# Patient Record
Sex: Male | Born: 1970 | Race: White | Hispanic: No | Smoking: Current every day smoker
Health system: Southern US, Community
[De-identification: ages and names within clinical notes are randomized; demographics above are authoritative.]

---

## 2010-07-23 ENCOUNTER — Ambulatory Visit: Payer: Self-pay | Admitting: Diagnostic Radiology

## 2010-07-23 ENCOUNTER — Emergency Department (HOSPITAL_BASED_OUTPATIENT_CLINIC_OR_DEPARTMENT_OTHER): Admission: EM | Admit: 2010-07-23 | Discharge: 2010-07-23 | Payer: Self-pay | Admitting: Emergency Medicine

## 2011-01-31 LAB — CBC
MCHC: 35.3 g/dL (ref 30.0–36.0)
MCV: 96.6 fL (ref 78.0–100.0)
Platelets: 232 10*3/uL (ref 150–400)
RDW: 12.3 % (ref 11.5–15.5)
WBC: 12.8 10*3/uL — ABNORMAL HIGH (ref 4.0–10.5)

## 2011-01-31 LAB — DIFFERENTIAL
Basophils Absolute: 0.1 10*3/uL (ref 0.0–0.1)
Basophils Relative: 1 % (ref 0–1)
Eosinophils Relative: 0 % (ref 0–5)
Lymphocytes Relative: 5 % — ABNORMAL LOW (ref 12–46)
Monocytes Absolute: 0.5 10*3/uL (ref 0.1–1.0)

## 2011-01-31 LAB — BASIC METABOLIC PANEL
BUN: 13 mg/dL (ref 6–23)
Chloride: 99 mEq/L (ref 96–112)
Creatinine, Ser: 0.8 mg/dL (ref 0.4–1.5)
GFR calc non Af Amer: >60 mL/min (ref >60)
Glucose, Bld: 236 mg/dL — ABNORMAL HIGH (ref 70–99)

## 2011-06-21 IMAGING — CR DG TIBIA/FIBULA 2V*R*
4 series · 4 of 4 positions shown · non-contrast
Comparison: None

CLINICAL DATA: Laceration to right lower leg.

RIGHT TIBIA AND FIBULA - 2 VIEW

[t tib/fib ap right (1 of 2)]
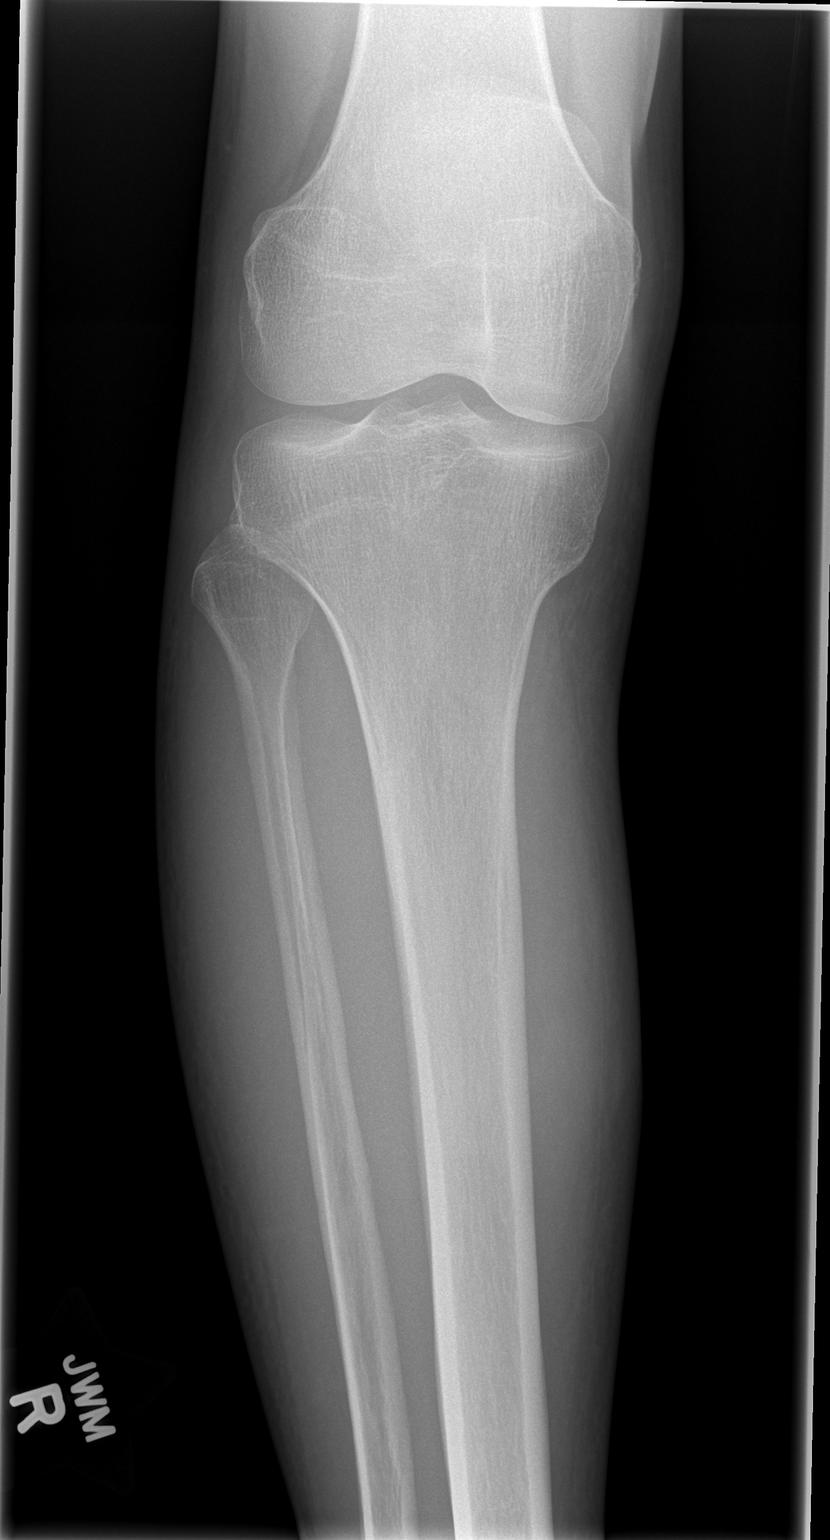

[t tib/fib ap right (2 of 2)]
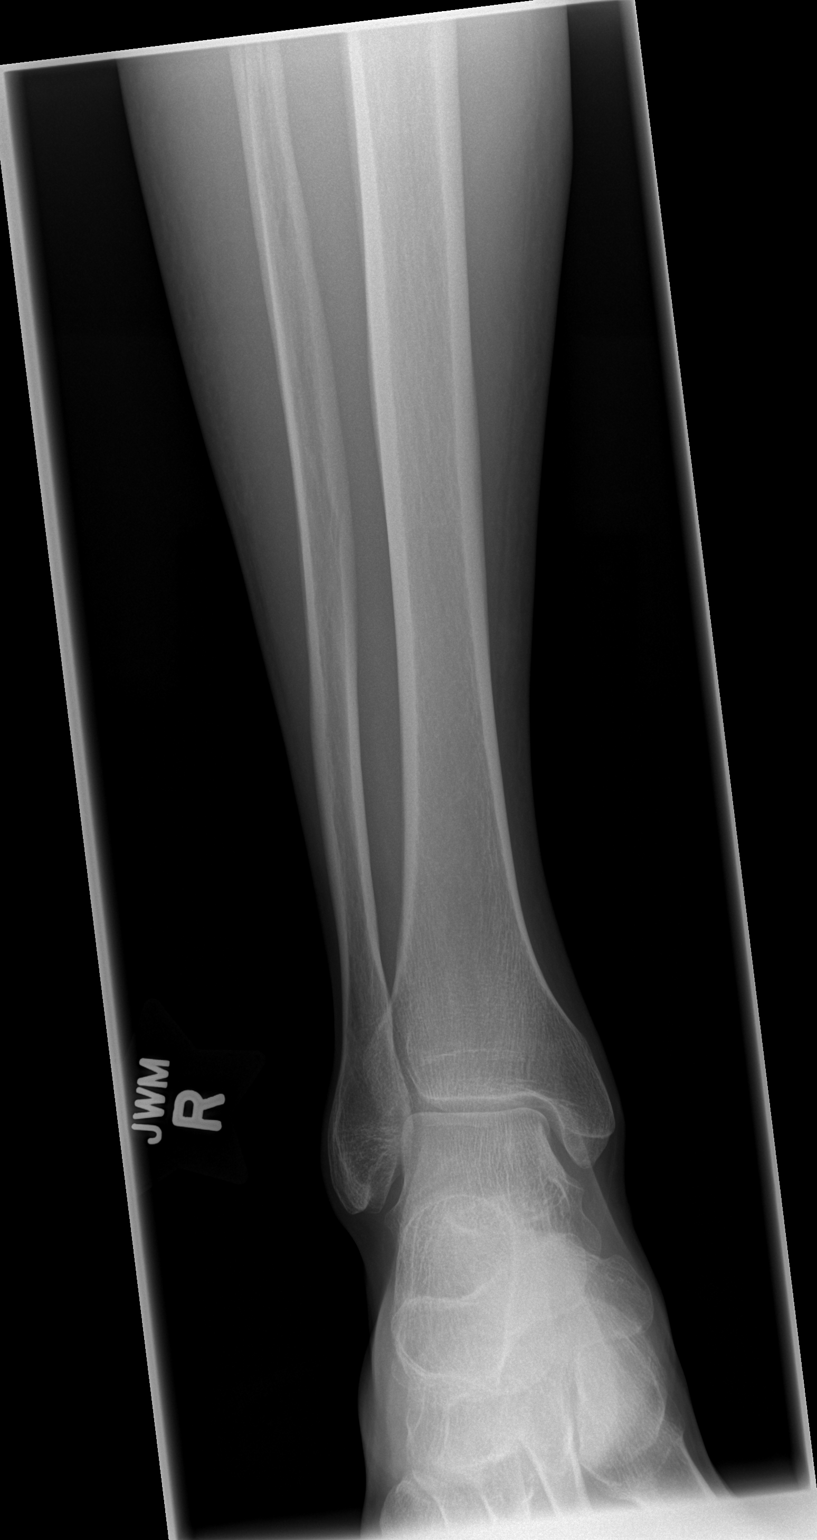

[t tib/fib lat right (1 of 2)]
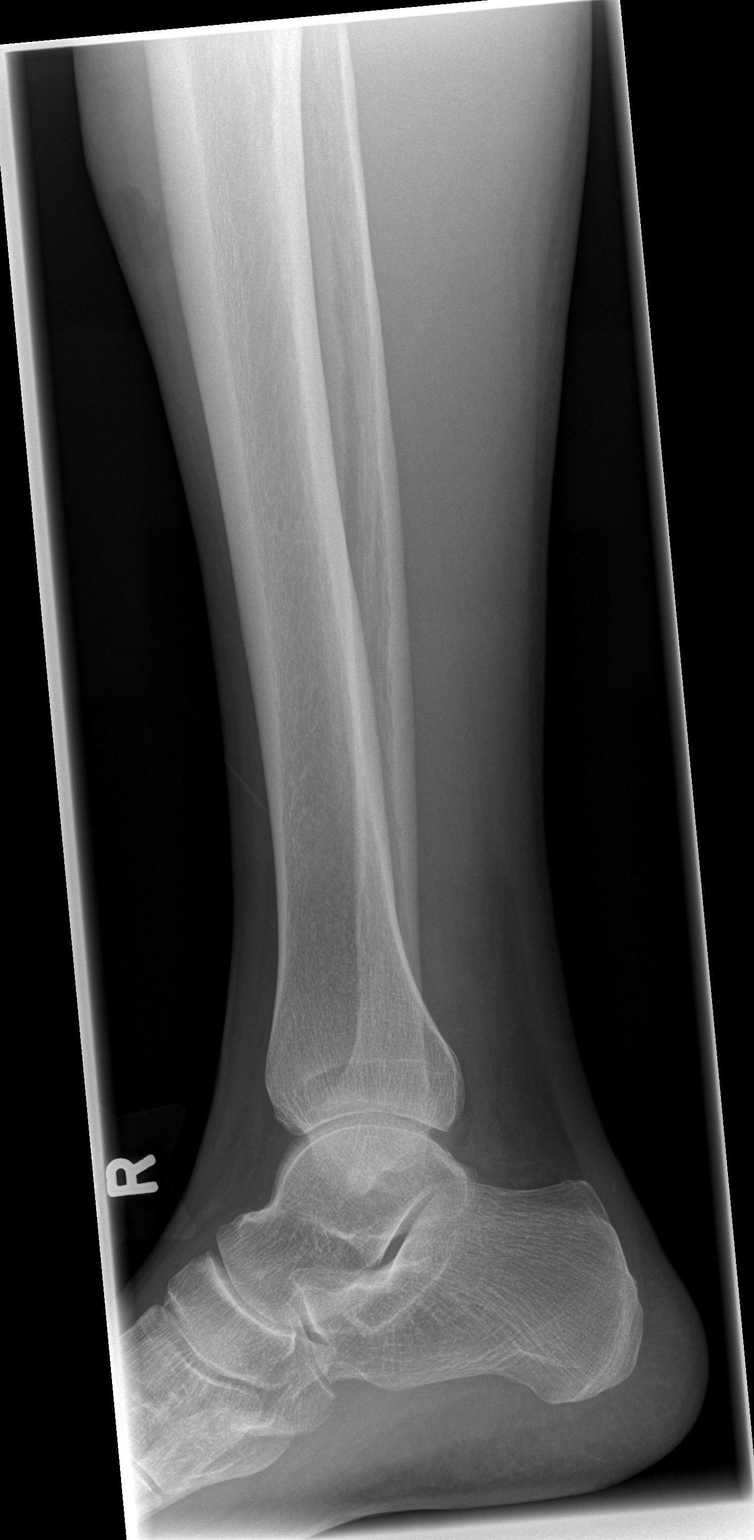

[t tib/fib lat right (2 of 2)]
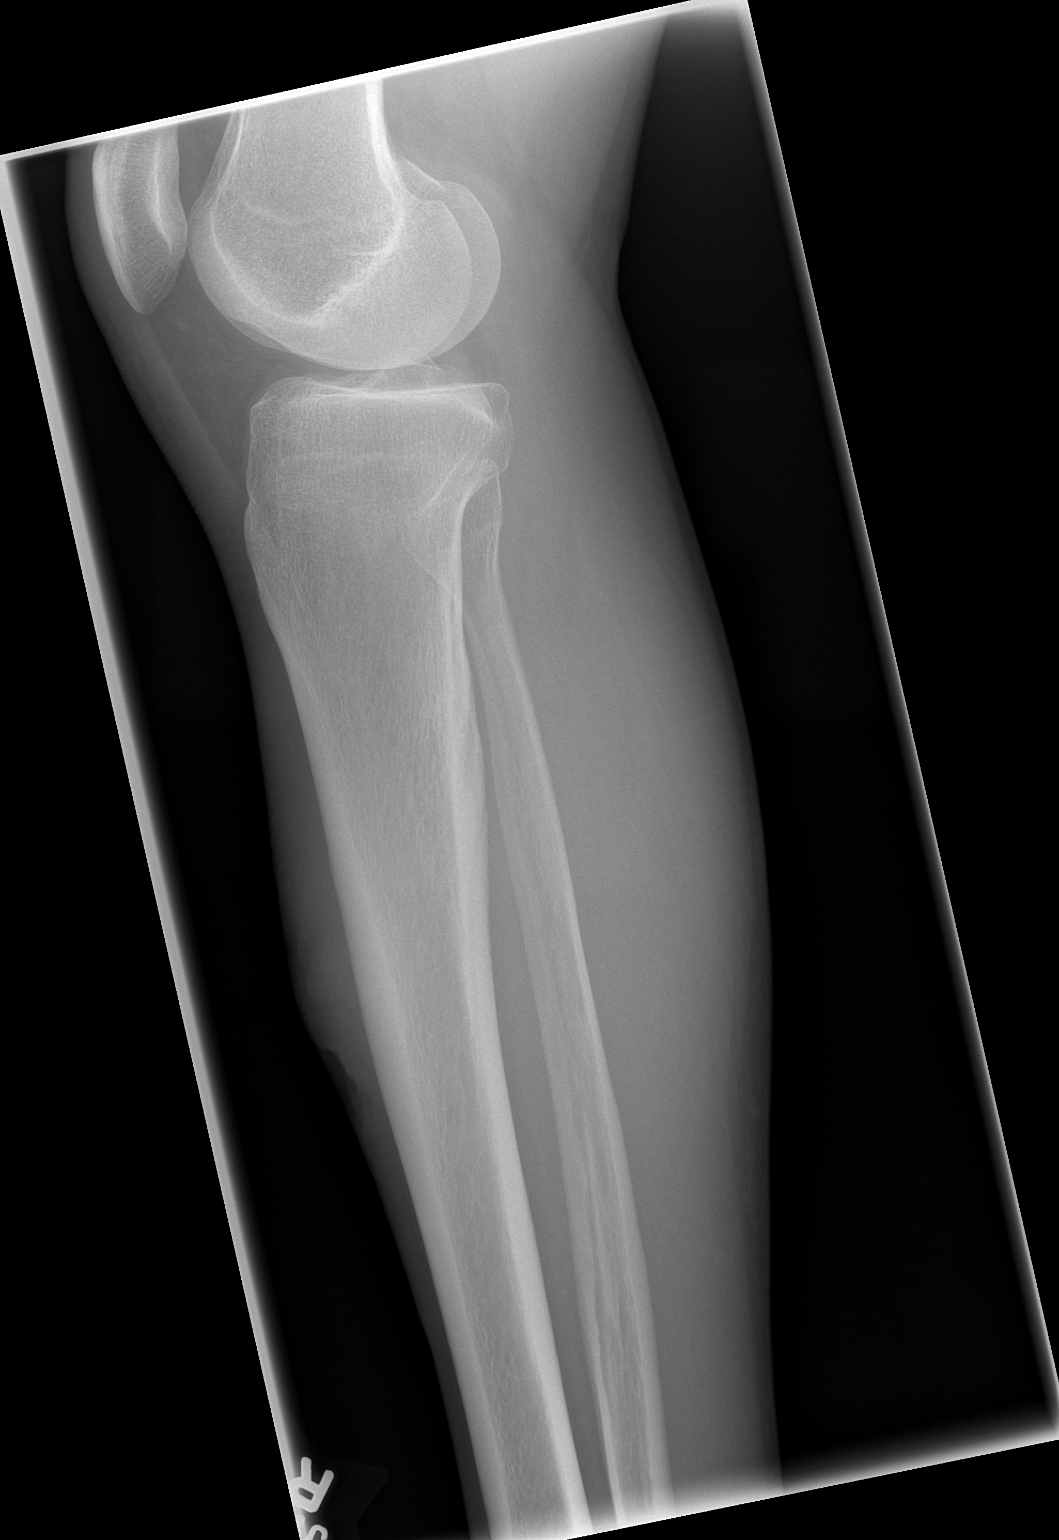

[4 of 4 positions shown; findings below may reference images not displayed]

FINDINGS: There is no evidence of fracture, subluxation or
dislocation.
The joint spaces are unremarkable.
There is no evidence of radiopaque foreign body.
Anterior soft tissue injury is noted.
IMPRESSION: Anterior soft tissue injury without acute bony abnormality or
radiopaque foreign body.

## 2012-11-18 HISTORY — PX: DEBRIDEMENT LEG: SUR390

## 2022-12-11 ENCOUNTER — Other Ambulatory Visit: Payer: Self-pay | Admitting: Family Medicine

## 2022-12-11 DIAGNOSIS — F17200 Nicotine dependence, unspecified, uncomplicated: Secondary | ICD-10-CM

## 2022-12-11 DIAGNOSIS — Z122 Encounter for screening for malignant neoplasm of respiratory organs: Secondary | ICD-10-CM

## 2022-12-12 ENCOUNTER — Other Ambulatory Visit: Payer: Self-pay

## 2022-12-12 ENCOUNTER — Emergency Department (HOSPITAL_BASED_OUTPATIENT_CLINIC_OR_DEPARTMENT_OTHER)
Admission: EM | Admit: 2022-12-12 | Discharge: 2022-12-12 | Disposition: A | Discharge status: Home / Self Care | Payer: Commercial Managed Care - PPO | Attending: Emergency Medicine | Admitting: Emergency Medicine

## 2022-12-12 DIAGNOSIS — K029 Dental caries, unspecified: Secondary | ICD-10-CM | POA: Diagnosis not present

## 2022-12-12 DIAGNOSIS — K1370 Unspecified lesions of oral mucosa: Secondary | ICD-10-CM | POA: Diagnosis not present

## 2022-12-12 DIAGNOSIS — Z87891 Personal history of nicotine dependence: Secondary | ICD-10-CM | POA: Diagnosis not present

## 2022-12-12 DIAGNOSIS — K0889 Other specified disorders of teeth and supporting structures: Secondary | ICD-10-CM | POA: Diagnosis present

## 2022-12-12 NOTE — ED Notes (Signed)
States he has an infection on the bottom left teeth near front of mouth, states has been on abx for the past 10 days, but is not having any improvement, only mild relief in pain. For the past few days has not noted any fevers. Has pain and discomfort when attempting to eat or drink.

## 2022-12-12 NOTE — ED Provider Notes (Signed)
[facility] Provider Note   CSN: [id number] Arrival date & time: 12/12/22  1623     History  Chief Complaint  Patient presents with   Dental Pain    Frank Park is a 52 y.o. male with past medical history of tobacco abuse who presents to the ED complaining of pain and swelling under the left side of his tongue for the last 9 to 10 days.  Patient states that he was evaluated by primary care for this and was at first prescribed Augmentin which she took for 3 to 4 days with no relief and an antibiotic was changed to clindamycin which she has been taking since that time but still has had no relief.  He has some increased pain with chewing but no difficulty swallowing, no sore throat, no fever, no chills, no nausea, no dental pain, no vomiting, and no other symptoms.      Home Medications Clindamycin  Allergies    Patient has no known allergies.    Review of Systems   Review of Systems  Constitutional:  Negative for activity change, appetite change, chills and fever.  HENT:  Negative for congestion, dental problem, drooling, ear pain, sinus pressure, sinus pain, sore throat, trouble swallowing and voice change.        Sublingual lesion  Eyes:  Negative for pain and visual disturbance.  Respiratory:  Negative for apnea, cough, choking, chest tightness, shortness of breath and wheezing.   Cardiovascular:  Negative for chest pain and palpitations.  Gastrointestinal:  Negative for abdominal pain, diarrhea, nausea and vomiting.  Genitourinary:  Negative for dysuria and hematuria.  Musculoskeletal:  Negative for arthralgias, back pain, neck pain and neck stiffness.  Skin:  Negative for color change and rash.  Neurological:  Negative for dizziness, seizures, syncope, facial asymmetry, light-headedness, numbness and headaches.  Psychiatric/Behavioral:  Negative for confusion.   All other systems reviewed and are negative.   Physical  Exam Updated Vital Signs BP 122/81 (BP Location: Right Arm)   Pulse 93   Temp 98.6 F (37 C) (Oral)   Resp 17   SpO2 98%  Physical Exam Vitals and nursing note reviewed.  Constitutional:      General: He is not in acute distress.    Appearance: Normal appearance. He is not ill-appearing or toxic-appearing.  HENT:     Head: Normocephalic and atraumatic.     Nose: Nose normal.     Mouth/Throat:     Lips: Pink. No lesions.     Mouth: Mucous membranes are moist. No angioedema.     Dentition: Dental caries (diffusely) present.     Tongue: Lesions (left mid sublingual, questionable leukoplakia, localized tenderness, no significant induration) present. Tongue does not deviate from midline.     Pharynx: Oropharynx is clear. Uvula midline. No pharyngeal swelling, oropharyngeal exudate, posterior oropharyngeal erythema or uvula swelling.     Tonsils: No tonsillar exudate or tonsillar abscesses.  Eyes:     Conjunctiva/sclera: Conjunctivae normal.  Cardiovascular:     Rate and Rhythm: Normal rate and regular rhythm.     Heart sounds: No murmur heard. Pulmonary:     Effort: Pulmonary effort is normal. No respiratory distress.     Breath sounds: Normal breath sounds. No stridor. No wheezing, rhonchi or rales.  Abdominal:     General: Abdomen is flat.     Palpations: Abdomen is soft.  Musculoskeletal:        General: Normal range of motion.  Cervical back: Neck supple.  Skin:    General: Skin is warm and dry.     Capillary Refill: Capillary refill takes less than 2 seconds.  Neurological:     Mental Status: He is alert. Mental status is at baseline.  Psychiatric:        Behavior: Behavior normal.     ED Results / Procedures / Treatments   Labs (all labs ordered are listed, but only abnormal results are displayed) Labs Reviewed - No data to display  EKG None  Radiology No results found.  Procedures Procedures   Medications Ordered in ED Medications - No data to  display  ED Course/ Medical Decision Making/ A&P Clinical Course as of 12/12/22 1753  Thu Dec 12, 2022  1730 Evaluated at bedside.  Patient has a history of alcohol use, chewing tobacco use, active smoking.  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 Swelling is progressive despite 2 rounds of antibiotics.  Will refer to ENT, recommend follow-up with PCP in interim.  2 different ENT information is given to patient for outpatient evaluation, biopsy is needed. [CC]  1734 No evidence of Ludwig's angina at this time, retropharyngeal abscess, peritonsillar abscess, odontogenic infection based on location. [CC]    Clinical Course User Index [CC] Tretha Sciara, MD                             Medical Decision Making  This is a 52 year old male presenting to ED for evaluation of sublingual lesion unimproved with 2 different antibiotics. Differential including but not limited to dental abscess, retropharyngeal abscess, sialoadenitis, Ludwig's angina, malignancy. With lack of improvement with antibiotics and physical exam findings in setting of pt's history of tobacco abuse, high suspicion for malignancy. No signs of airway compromise, no infectious symptoms to suggest peritonsillar or retropharyngeal abscess. Location would be unusual for dental etiology. Attending MD also evaluated pt and agreeable with this. ENT follow up provided. Pt strongly encouraged to follow up closely with either ENT provided as well as his PCP. Pt stable for discharge, all questions answered.          Final Clinical Impression(s) / ED Diagnoses Final diagnoses:  Mouth lesion    Rx / DC Orders ED Discharge Orders     None         Suzzette Righter, PA-C 12/12/22 1754    Tretha Sciara, MD 12/16/22 1830

## 2022-12-12 NOTE — ED Triage Notes (Signed)
Pt presents with dental pain on the bottom left.  This has been bothering him about 10 days.  Has reportedly taken 2 different rounds of anbx with no relief.  No fever or chills.

## 2022-12-12 NOTE — Discharge Instructions (Addendum)
You were seen today for a swelling on the inside your mouth.  You have a white lesion underneath your tongue.  It does not appear to be an infection especially given 2 rounds of antibiotics without improvement.  I would recommend following up with an ear nose and throat doctor.  I have attached 2 different phone numbers to try to get in touch with a local ear nose and throat doctor.  This lesion may need biopsy and further workup.  I am worried about potential malignancy given your history of smoking, alcohol, chewing tobacco. Plan and next steps:   The following may be helpful in managing your symptoms:   Pain- Lidocaine topical cream Apply to affected area for up to 12 hours at a time.   Pain/Fever- Adult Tylenol dosing:  650 mg orally every 4 to 6 hours as needed, MAX: 3250 mg/24 hours   (Extra-strength) 1000 mg orally every 6 hours as needed; MAX: 3000 mg/24 hours   Do not use if you have liver disease. Read the label on the bottle.   Pain/Fever- Adult Ibuprofen Dosing  200 to 400 mg orally every 4 to 6 hours as needed; MAX 1200 mg/day; do not take longer than 10 days   Do not use if you have kidney disease. Read the label on the bottle   Gastritis/Heartburn- Omeprazole Take 20mg  Daily for 14 days  Findings:  You may see all of your lab and imaging results utilizing our online portal! Look in this document or ask a team member for your mychart* access information. The most notable results have additionally been verbally communicated with you and your bedside family.    Follow-up Plan:   Follow up with the patient's normal primary care provider for monitoring of this condition within 48 hours.  I have attached 2 different ENT for numbers to call for further care and management.  Signs/Symptoms that would warrant return to the ED:  Please return to the ED if you experience worsening of symptoms or any abrupt changes in your health. Standard of care precautions for your chief complaint  have already been verbally communicated with you. Always be on alert for fevers, chills, shortness of breath, chest pains, or sudden changes that warrant immediate evaluation.    Thank you for allowing [place] to be a part of you and your families' care.   Tretha Sciara MD

## 2022-12-25 ENCOUNTER — Ambulatory Visit
Admission: RE | Admit: 2022-12-25 | Discharge: 2022-12-25 | Disposition: A | Discharge status: Home / Self Care | Payer: Commercial Managed Care - PPO | Source: Ambulatory Visit | Attending: Family Medicine | Admitting: Family Medicine

## 2022-12-25 DIAGNOSIS — Z122 Encounter for screening for malignant neoplasm of respiratory organs: Secondary | ICD-10-CM

## 2022-12-25 DIAGNOSIS — F17200 Nicotine dependence, unspecified, uncomplicated: Secondary | ICD-10-CM

## 2022-12-31 ENCOUNTER — Other Ambulatory Visit: Payer: Self-pay | Admitting: Otolaryngology

## 2022-12-31 DIAGNOSIS — K115 Sialolithiasis: Secondary | ICD-10-CM

## 2022-12-31 DIAGNOSIS — K1120 Sialoadenitis, unspecified: Secondary | ICD-10-CM

## 2023-01-03 ENCOUNTER — Encounter: Payer: Self-pay | Admitting: Interventional Cardiology

## 2023-01-03 ENCOUNTER — Ambulatory Visit: Payer: Self-pay | Attending: Interventional Cardiology | Admitting: Interventional Cardiology

## 2023-01-03 VITALS — BP 134/78 | HR 76 | Ht 70.0 in | Wt 143.0 lb

## 2023-01-03 DIAGNOSIS — Z8249 Family history of ischemic heart disease and other diseases of the circulatory system: Secondary | ICD-10-CM

## 2023-01-03 DIAGNOSIS — R0609 Other forms of dyspnea: Secondary | ICD-10-CM

## 2023-01-03 DIAGNOSIS — I2584 Coronary atherosclerosis due to calcified coronary lesion: Secondary | ICD-10-CM

## 2023-01-03 DIAGNOSIS — I700 Atherosclerosis of aorta: Secondary | ICD-10-CM

## 2023-01-03 DIAGNOSIS — I2510 Atherosclerotic heart disease of native coronary artery without angina pectoris: Secondary | ICD-10-CM

## 2023-01-03 DIAGNOSIS — Z720 Tobacco use: Secondary | ICD-10-CM

## 2023-01-03 MED ORDER — NICOTINE 21 MG/24HR TD PT24: 21.0000 mg | MEDICATED_PATCH | Freq: Every day | TRANSDERMAL | 2 refills | Status: AC

## 2023-01-03 MED ORDER — METOPROLOL TARTRATE 100 MG PO TABS: ORAL_TABLET | ORAL | 0 refills | Status: AC

## 2023-01-03 NOTE — Patient Instructions (Addendum)
Medication Instructions:  Your physician has recommended you make the following change in your medication: Start nicotine patch 21 mg to skin daily   *If you need a refill on your cardiac medications before your next appointment, please call your pharmacy*   Lab Work: Lab work to be done today--BMP If you have labs (blood work) drawn today and your tests are completely normal, you will receive your results only by: [place] (if you have MyChart) OR A paper copy in the mail If you have any lab test that is abnormal or we need to change your treatment, we will call you to review the results.   Testing/Procedures: Your physician has requested that you have cardiac CT. Cardiac computed tomography (CT) is a painless test that uses an x-ray machine to take clear, detailed pictures of your heart. For further information please visit [email or web address]. Please follow instruction sheet as given.     Follow-Up: At [facility], you and your health needs are our priority.  As part of our continuing mission to provide you with exceptional heart care, we have created designated Provider Care Teams.  These Care Teams include your primary Cardiologist (physician) and Advanced Practice Providers (APPs -  Physician Assistants and Nurse Practitioners) who all work together to provide you with the care you need, when you need it.  We recommend signing up for the patient portal called "MyChart".  Sign up information is provided on this After Visit Summary.  MyChart is used to connect with patients for Virtual Visits (Telemedicine).  Patients are able to view lab/test results, encounter notes, upcoming appointments, etc.  Non-urgent messages can be sent to your provider as well.   To learn more about what you can do with MyChart, go to [email or web address].    Your next appointment:   12 month(s)  Provider:   Larae Grooms, MD     Other Instructions    Your cardiac CT will be  scheduled at one of the below locations:   [facility] [address] [place], [place] [address] [phone]  [place] [address] [place], [place] [address] [phone]  [facility] [place], [place] [address] [phone]  If scheduled at [facility], please arrive at the [facility] and Children's Entrance (Entrance C2) of [facility] 30 minutes prior to test start time. You can use the FREE valet parking offered at entrance C (encouraged to control the heart rate for the test)  Proceed to the [facility] Radiology Department (first floor) to check-in and test prep.  All radiology patients and guests should use entrance C2 at [facility], accessed from [facility], even though the hospital's physical address listed is [address]    If scheduled at [facility] or [facility], please arrive 15 mins early for check-in and test prep.   Please follow these instructions carefully (unless otherwise directed):  Hold all erectile dysfunction medications at least 3 days (72 hrs) prior to test. (Ie viagra, cialis, sildenafil, tadalafil, etc) We will administer nitroglycerin during this exam.   On the Night Before the Test: Be sure to Drink plenty of water. Do not consume any caffeinated/decaffeinated beverages or chocolate 12 hours prior to your test. Do not take any antihistamines 12 hours prior to your test.  On the Day of the Test: Drink plenty of water until  1 hour prior to the test. Do not eat any food 1 hour prior to test. You may take your regular medications prior to the test.  Take metoprolol (Lopressor) two hours prior to test.         After the Test: Drink plenty of water. After receiving IV contrast, you may experience a mild flushed feeling. This is  normal. On occasion, you may experience a mild rash up to 24 hours after the test. This is not dangerous. If this occurs, you can take Benadryl 25 mg and increase your fluid intake. If you experience trouble breathing, this can be serious. If it is severe call 911 IMMEDIATELY. If it is mild, please call our office. If you take any of these medications: Glipizide/Metformin, Avandament, Glucavance, please do not take 48 hours after completing test unless otherwise instructed.  We will call to schedule your test 2-4 weeks out understanding that some insurance companies will need an authorization prior to the service being performed.   For non-scheduling related questions, please contact the cardiac imaging nurse navigator should you have any questions/concerns: Marchia Bond, Cardiac Imaging Nurse Navigator Gordy Clement, Cardiac Imaging Nurse Navigator [facility] Direct Office Dial: [phone]   For scheduling needs, including cancellations and rescheduling, please call [place], [phone].

## 2023-01-03 NOTE — Progress Notes (Signed)
Cardiology Office Note   Date:  01/03/2023   ID:  Frank Park, DOB Feb 18, 1971, MRN [id number]  PCP:  Ferd Hibbs, NP    No chief complaint on file.  Family h/o CAD, coronary artery atherosclerosis  Wt Readings from Last 3 Encounters:  01/03/23 143 lb (64.9 kg)       History of Present Illness: Frank Park is a 52 y.o. male who is being seen today for the evaluation of coronary artery calcification at the request of Ferd Hibbs, NP.   Chest CT in February 2024 showed: "Cardiovascular: Aortic atherosclerosis. Normal heart size, without pericardial effusion. Lad coronary artery calcification."  He does note some shortness of breath.  Mother had CVA and MI in her 71s.  Works as a Administrator. Still smokes.  1 ppd currently, 1.5 ppd previously.    In 2024, he had a swelling in the left oral area.  THere was a concern for cancer but it turned out to be a stone obstructing a salivary gland.  It caused severe pain and he had to miss some time from work so he is concerned about his employment.  He mentioned that they asked him to take a drug test but he was not well enough to drive to the testing site so he declined and that was a mark against him.  Swelling resolved after the stone passed.  Denies : Chest pain. Dizziness. Leg edema. Nitroglycerin use. Orthopnea. Palpitations. Paroxysmal nocturnal dyspnea. Syncope.     History reviewed. No pertinent past medical history.  Past Surgical History:  Procedure Laterality Date   DEBRIDEMENT LEG Right 2014     Current Outpatient Medications  Medication Sig Dispense Refill   ibuprofen (ADVIL) 100 MG chewable tablet Chew by mouth every 8 (eight) hours as needed.     No current facility-administered medications for this visit.    Allergies:   Penicillins    Social History:  The patient  reports that he has been smoking cigarettes. He has a 40.00 pack-year smoking history. He has never used smokeless tobacco. He  reports current alcohol use of about 42.0 standard drinks of alcohol per week. He reports that he does not currently use drugs after having used the following drugs: Cocaine and Marijuana.   Family History:  The patient's family history includes Atrial fibrillation in his mother; Heart attack in his brother and mother; Stroke in his mother.    ROS:  Please see the history of present illness.   Otherwise, review of systems are positive for dyspnea on exertion.   All other systems are reviewed and negative.    PHYSICAL EXAM: VS:  BP 134/78   Pulse 76   Ht 5' 10"$  (1.778 m)   Wt 143 lb (64.9 kg)   SpO2 96%   BMI 20.52 kg/m  , BMI Body mass index is 20.52 kg/m. GEN: Well nourished, well developed, in no acute distress HEENT: normal Neck: no JVD, carotid bruits, or masses Cardiac:  RRR; no murmurs, rubs, or gallops,no edema  Respiratory:  clear to auscultation bilaterally, normal work of breathing GI: soft, nontender, nondistended, + BS MS: no deformity or atrophy Skin: warm and dry, no rash Neuro:  Strength and sensation are intact Psych: euthymic mood, full affect   EKG:   The ekg ordered today demonstrates NSR, no ST changes   Recent Labs: No results found for requested labs within last 365 days.   Lipid Panel No results found for: "CHOL", "TRIG", "HDL", "CHOLHDL", "VLDL", "  [place]", "LDLDIRECT"   Other studies Reviewed: Additional studies/ records that were reviewed today with results demonstrating: LDL 36, HDL 97 in January 29, 2023   ASSESSMENT AND PLAN:  Coronary artery calcification: Has some DOE which could be an anginal equivalent.  Plan for CTA coronaries.  Start aspirin 81 mg daily. Has had some increased stress from the job.  He mentioned that he may be losing his job because of differences he had with his employer. Aortic atherosclerosis: We discussed statin.  COnsider Starting rosuvastatin 5 mg daily based on the CTA results. Plan for lipids and liver tests in 3 months.   Very low LDL based on lab work sent from his primary care doctor.  Would like to see the calcium score prior to starting lipid-lowering therapy.  Could even consider just starting once a week statin therapy. Tobacco abuse: tried nicorette gum in the past, but it did not work.  Continue patch 21 mg/day. Family h/o CAD: younger Brother died of an MI in 01-28-2021.   Current medicines are reviewed at length with the patient today.  The patient concerns regarding his medicines were addressed.  The following changes have been made: as above  Labs/ tests ordered today include:  No orders of the defined types were placed in this encounter.   Recommend 150 minutes/week of aerobic exercise Low fat, low carb, high fiber diet recommended  Disposition:   FU in 1 year or sooner if the CTA shows significant abnormality   Signed, Larae Grooms, MD  01/03/2023 8:20 AM    [place], [place], [place]  [address] Phone: [phone]; Fax: [phone]

## 2023-01-04 LAB — BASIC METABOLIC PANEL
BUN/Creatinine Ratio: 9 (ref 9–20)
BUN: 6 mg/dL (ref 6–24)
CO2: 23 mmol/L (ref 20–29)
Calcium: 9.2 mg/dL (ref 8.7–10.2)
Chloride: 93 mmol/L — ABNORMAL LOW (ref 96–106)
Creatinine, Ser: 0.7 mg/dL — ABNORMAL LOW (ref 0.76–1.27)
Glucose: 73 mg/dL (ref 70–99)
Potassium: 5 mmol/L (ref 3.5–5.2)
Sodium: 133 mmol/L — ABNORMAL LOW (ref 134–144)
eGFR: 110 mL/min/{1.73_m2} (ref >59)

## 2023-01-08 ENCOUNTER — Other Ambulatory Visit: Payer: Self-pay

## 2023-03-06 ENCOUNTER — Telehealth: Payer: Self-pay | Admitting: *Deleted

## 2023-03-06 NOTE — Telephone Encounter (Signed)
Order note indicates Cardiac CT has not been scheduled because patient wanted to check with his insurance.  I placed call to patient to follow up.  Unable to leave a message as voicemail is not set up

## 2023-03-13 NOTE — Telephone Encounter (Signed)
Call placed to patient.  No answer and mailbox is full

## 2023-03-27 ENCOUNTER — Encounter (HOSPITAL_COMMUNITY): Payer: Self-pay

## 2023-03-27 NOTE — Telephone Encounter (Signed)
Left message to call office

## 2023-04-07 NOTE — Telephone Encounter (Signed)
Call placed to patient.  Unable to leave a message as mailbox is full
